# Patient Record
Sex: Female | Born: 1960 | Race: Black or African American | Hispanic: No | Marital: Married | Smoking: Former smoker
Health system: Southern US, Community
[De-identification: ages and names within clinical notes are randomized; demographics above are authoritative.]

## PROBLEM LIST (undated history)

## (undated) DIAGNOSIS — K219 Gastro-esophageal reflux disease without esophagitis: Secondary | ICD-10-CM

## (undated) DIAGNOSIS — Z970 Presence of artificial eye: Secondary | ICD-10-CM

## (undated) HISTORY — PX: EYE SURGERY: SHX253

---

## 1999-08-06 ENCOUNTER — Emergency Department (HOSPITAL_COMMUNITY): Admission: EM | Admit: 1999-08-06 | Discharge: 1999-08-07 | Payer: Self-pay | Admitting: Emergency Medicine

## 2004-12-19 ENCOUNTER — Emergency Department: Payer: Self-pay | Admitting: Emergency Medicine

## 2013-03-09 ENCOUNTER — Emergency Department: Payer: Self-pay | Admitting: Emergency Medicine

## 2013-03-09 LAB — BASIC METABOLIC PANEL
Anion Gap: 5 — ABNORMAL LOW (ref 7–16)
BUN: 10 mg/dL (ref 7–18)
Calcium, Total: 8.5 mg/dL (ref 8.5–10.1)
Chloride: 107 mmol/L (ref 98–107)
Co2: 26 mmol/L (ref 21–32)
Creatinine: 0.77 mg/dL (ref 0.60–1.30)
EGFR (Non-African Amer.): >60
Glucose: 95 mg/dL (ref 65–99)
Potassium: 3.9 mmol/L (ref 3.5–5.1)

## 2013-03-09 LAB — CBC
HCT: 34.6 % — ABNORMAL LOW (ref 35.0–47.0)
HGB: 11.8 g/dL — ABNORMAL LOW (ref 12.0–16.0)
MCHC: 34.1 g/dL (ref 32.0–36.0)
Platelet: 195 10*3/uL (ref 150–440)
RBC: 3.85 10*6/uL (ref 3.80–5.20)
RDW: 15 % — ABNORMAL HIGH (ref 11.5–14.5)

## 2017-08-12 NOTE — H&P (Signed)
Nancy Costa is a 57 y.o. female here for Fractional D+C and myosure resection of endometrial polyp . Pt with AUB  .    embx done :ENDOMETRIUM, BIOPSY:  BLOOD CLOTS AND SPARSE FRAGMENTS OF ENDOMETRIUM WITH BLEEDING/SHEDDING.  Here for SIS : + endometrial polyp  1.7x0.6 x0.5 cm   Fibroids x2 each 2 cm  Past Medical History:  has a past medical history of History of eye removal, Obesity, unspecified, and Tobacco use.  Past Surgical History:  has a past surgical history that includes SURGICAL REMOVAL RIGHT EYE; Cesarean section; and Tubal ligation. Family History: family history includes Breast cancer (age of onset: 74) in her sister; Heart disease in her father; High blood pressure (Hypertension) in her father and mother. Social History:  reports that she quit smoking about 2 years ago. Her smoking use included cigarettes. She has never used smokeless tobacco. She reports that she drinks alcohol. She reports that she does not use drugs. OB/GYN History:          OB History    Gravida  3   Para  2   Term      Preterm      AB  1   Living  2     SAB      TAB  1   Ectopic      Molar      Multiple      Live Births  2          Allergies: has No Known Allergies. Medications: No current outpatient medications on file.  Review of Systems: General:                      No fatigue or weight loss Eyes:                           No vision changes Ears:                            No hearing difficulty Respiratory:                No cough or shortness of breath Pulmonary:                  No asthma or shortness of breath Cardiovascular:           No chest pain, palpitations, dyspnea on exertion Gastrointestinal:          No abdominal bloating, chronic diarrhea, constipations, masses, pain or hematochezia Genitourinary:             No hematuria, dysuria, abnormal vaginal discharge, pelvic pain, Menometrorrhagia Lymphatic:                   No swollen lymph  nodes Musculoskeletal:         No muscle weakness Neurologic:                  No extremity weakness, syncope, seizure disorder Psychiatric:                  No history of depression, delusions or suicidal/homicidal ideation    Exam:      Vitals:   08/09/17 1043  BP: 143/85  Pulse: 69    Body mass index is 48.06 kg/m.  WDWN / black female in NAD   Lungs: CTA  CV : RRR without murmur  Neck:  no thyromegaly Abdomen: soft , no mass, normal active bowel sounds,  non-tender, no rebound tenderness Pelvic: tanner stage 5 ,  External genitalia: vulva /labia no lesions Urethra: no prolapse Vagina: normal physiologic d/c Cervix: no lesions, no cervical motion tenderness   Uterus: normal size shape and contour, non-tender Adnexa: no mass,  non-tender   Rectovaginal:  Saline infusion sonohysterography: betadine prep to the cervix followed by placement of the HSG catheter into the endometrial canal . Sterile H2O is injected while performing a transvaginal u/s . Findings: 1.7 x0.6 cm  Endometrial polyp   Impression:   The primary encounter diagnosis was Abnormal uterine bleeding (AUB), unspecified. A diagnosis of Endometrium, polyp was also pertinent to this visit.    Plan:    Spoke to her about Fractional D+C and myosure resection of endometrial polyp .She agrees to undergo the procedure  Benefits and risks to surgery: The proposed benefit of the surgery has been discussed with the patient. The possible risks include, but are not limited to: organ injury to the bowel , bladder, ureters, and major blood vessels and nerves. There is a possibility of additional surgeries resulting from these injuries. There is also the risk of blood transfusion and the need to receive blood products during or after the procedure which may rarely lead to HIV or Hepatitis C infection. There is a risk of developing a deep venous thrombosis or a pulmonary embolism . There is the possibility of  wound infection and also anesthetic complications, even the rare possibility of death. The patient understands these risks and wishes to proceed. All questions have been answered and the consent has been signed.        Orders Placed This Encounter  Procedures  . FSH, Serum - [facility]    Return if symptoms worsen or fail to improve, for preop.  Vilma PraderHOMAS JANSE SCHERMERHORN, MD

## 2017-08-18 ENCOUNTER — Encounter
Admission: RE | Admit: 2017-08-18 | Discharge: 2017-08-18 | Disposition: A | Discharge status: Home / Self Care | Payer: BC Managed Care – PPO | Source: Ambulatory Visit | Attending: Obstetrics and Gynecology | Admitting: Obstetrics and Gynecology

## 2017-08-18 ENCOUNTER — Other Ambulatory Visit: Payer: Self-pay

## 2017-08-18 HISTORY — DX: Gastro-esophageal reflux disease without esophagitis: K21.9

## 2017-08-18 HISTORY — DX: Presence of artificial eye: Z97.0

## 2017-08-18 NOTE — Patient Instructions (Signed)
Your procedure is scheduled on: 08-26-17 FRIDAY Report to Same Day Surgery 2nd floor medical mall [facility](Medical Mall Entrance-take elevator on left to 2nd floor.  Check in with surgery information desk.) To find out your arrival time please call [phone] between 1PM - 3PM on 08-25-17 THURSDAY  Remember: Instructions that are not followed completely may result in serious medical risk, up to and including death, or upon the discretion of your surgeon and anesthesiologist your surgery may need to be rescheduled.    _x___ 1. Do not eat food after midnight the night before your procedure. NO GUM OR CANDY AFTER MIDNIGHT.  You may drink clear liquids up to 2 hours before you are scheduled to arrive at the hospital for your procedure.  Do not drink clear liquids within 2 hours of your scheduled arrival to the hospital.  Clear liquids include  --Water or Apple juice without pulp  --Clear carbohydrate beverage such as ClearFast or Gatorade  --Black Coffee or Clear Tea (No milk, no creamers, do not add anything to the coffee or Tea     __x__ 2. No Alcohol for 24 hours before or after surgery.   __x__3. No Smoking or e-cigarettes for 24 prior to surgery.  Do not use any chewable tobacco products for at least 6 hour prior to surgery   ____  4. Bring all medications with you on the day of surgery if instructed.    __x__ 5. Notify your doctor if there is any change in your medical condition     (cold, fever, infections).    x___6. On the morning of surgery brush your teeth with toothpaste and water.  You may rinse your mouth with mouth wash if you wish.  Do not swallow any toothpaste or mouthwash.   Do not wear jewelry, make-up, hairpins, clips or nail polish.  Do not wear lotions, powders, or perfumes. You may wear deodorant.  Do not shave 48 hours prior to surgery. Men may shave face and neck.  Do not bring valuables to the hospital.    [facility] any belongings or  valuables.               Contacts, dentures or bridgework may not be worn into surgery.  Leave your suitcase in the car. After surgery it may be brought to your room.  For patients admitted to the hospital, discharge time is determined by your treatment team.  _  Patients discharged the day of surgery will not be allowed to drive home.  You will need someone to drive you home and stay with you the night of your procedure.    Please read over the following fact sheets that you were given:   [facility] Pre[facility] and or MRSA Information   _x___ TAKE THE FOLLOWING MEDICATION THE MORNING OF SURGERY WITH A SMALL SIP OF WATER. These include:  1. PRILOSEC  2. TAKE A PRILOSEC THE NIGHT BEFORE SURGERY  3.  4.  5.  6.  ____Fleets enema or Magnesium Citrate as directed.   ____ Use CHG Soap or sage wipes as directed on instruction sheet   ____ Use inhalers on the day of surgery and bring to hospital day of surgery  ____ Stop Metformin and Janumet 2 days prior to surgery.    ____ Take 1/2 of usual insulin dose the night before surgery and none on the morning surgery.   ____ Follow recommendations from Cardiologist, Pulmonologist or PCP regarding stopping Aspirin,  Coumadin, Plavix ,Eliquis, Effient, or Pradaxa, and Pletal.  X____Stop Anti-inflammatories such as Advil, Aleve, Ibuprofen, Motrin, Naproxen, Naprosyn, Goodies powders or aspirin products NOW-OK to take Tylenol    ____ Stop supplements until after surgery.     ____ Bring C-Pap to the hospital.

## 2017-08-22 ENCOUNTER — Encounter
Admission: RE | Admit: 2017-08-22 | Discharge: 2017-08-22 | Disposition: A | Discharge status: Home / Self Care | Payer: BC Managed Care – PPO | Source: Ambulatory Visit | Attending: Obstetrics and Gynecology | Admitting: Obstetrics and Gynecology

## 2017-08-22 DIAGNOSIS — Z01818 Encounter for other preprocedural examination: Secondary | ICD-10-CM | POA: Diagnosis not present

## 2017-08-22 DIAGNOSIS — Z01812 Encounter for preprocedural laboratory examination: Secondary | ICD-10-CM | POA: Insufficient documentation

## 2017-08-22 LAB — BASIC METABOLIC PANEL
ANION GAP: 9 (ref 5–15)
BUN: 10 mg/dL (ref 6–20)
CHLORIDE: 105 mmol/L (ref 101–111)
CO2: 27 mmol/L (ref 22–32)
Calcium: 9.3 mg/dL (ref 8.9–10.3)
Creatinine, Ser: 0.79 mg/dL (ref 0.44–1.00)
GFR calc Af Amer: >60 mL/min (ref >60)
GLUCOSE: 98 mg/dL (ref 65–99)
POTASSIUM: 3.8 mmol/L (ref 3.5–5.1)
SODIUM: 141 mmol/L (ref 135–145)

## 2017-08-22 LAB — CBC
HEMATOCRIT: 36.9 % (ref 35.0–47.0)
HEMOGLOBIN: 12.2 g/dL (ref 12.0–16.0)
MCH: 30.4 pg (ref 26.0–34.0)
MCHC: 32.9 g/dL (ref 32.0–36.0)
MCV: 92.4 fL (ref 80.0–100.0)
Platelets: 207 10*3/uL (ref 150–440)
RBC: 4 MIL/uL (ref 3.80–5.20)
RDW: 14.3 % (ref 11.5–14.5)
WBC: 5.1 10*3/uL (ref 3.6–11.0)

## 2017-08-22 LAB — TYPE AND SCREEN
ABO/RH(D): O POS
ANTIBODY SCREEN: NEGATIVE

## 2017-08-25 ENCOUNTER — Encounter: Payer: Self-pay | Admitting: *Deleted

## 2017-08-26 ENCOUNTER — Ambulatory Visit: Payer: BC Managed Care – PPO | Admitting: Anesthesiology

## 2017-08-26 ENCOUNTER — Ambulatory Visit
Admission: RE | Admit: 2017-08-26 | Discharge: 2017-08-26 | Disposition: A | Discharge status: Home / Self Care | Payer: BC Managed Care – PPO | Source: Ambulatory Visit | Attending: Obstetrics and Gynecology | Admitting: Obstetrics and Gynecology

## 2017-08-26 ENCOUNTER — Encounter
Admission: RE | Disposition: A | Discharge status: Home / Self Care | Payer: Self-pay | Source: Ambulatory Visit | Attending: Obstetrics and Gynecology

## 2017-08-26 ENCOUNTER — Encounter: Payer: Self-pay | Admitting: Anesthesiology

## 2017-08-26 DIAGNOSIS — Z6841 Body Mass Index (BMI) 40.0 and over, adult: Secondary | ICD-10-CM | POA: Diagnosis not present

## 2017-08-26 DIAGNOSIS — N950 Postmenopausal bleeding: Secondary | ICD-10-CM | POA: Diagnosis present

## 2017-08-26 DIAGNOSIS — N840 Polyp of corpus uteri: Secondary | ICD-10-CM | POA: Insufficient documentation

## 2017-08-26 DIAGNOSIS — Z8249 Family history of ischemic heart disease and other diseases of the circulatory system: Secondary | ICD-10-CM | POA: Diagnosis not present

## 2017-08-26 DIAGNOSIS — Z803 Family history of malignant neoplasm of breast: Secondary | ICD-10-CM | POA: Diagnosis not present

## 2017-08-26 DIAGNOSIS — K219 Gastro-esophageal reflux disease without esophagitis: Secondary | ICD-10-CM | POA: Diagnosis not present

## 2017-08-26 DIAGNOSIS — Z87891 Personal history of nicotine dependence: Secondary | ICD-10-CM | POA: Diagnosis not present

## 2017-08-26 HISTORY — PX: DILATATION & CURETTAGE/HYSTEROSCOPY WITH MYOSURE: SHX6511

## 2017-08-26 LAB — ABO/RH: ABO/RH(D): O POS

## 2017-08-26 LAB — POCT PREGNANCY, URINE: PREG TEST UR: NEGATIVE

## 2017-08-26 SURGERY — DILATATION & CURETTAGE/HYSTEROSCOPY WITH MYOSURE
Anesthesia: General | Wound class: Clean Contaminated

## 2017-08-26 MED ORDER — FENTANYL CITRATE (PF) 100 MCG/2ML IJ SOLN
INTRAMUSCULAR | Status: DC | PRN
  Administered 2017-08-26 (×2): 50 ug via INTRAVENOUS

## 2017-08-26 MED ORDER — SUCCINYLCHOLINE CHLORIDE 20 MG/ML IJ SOLN
INTRAMUSCULAR | Status: DC | PRN
  Administered 2017-08-26: 100 mg via INTRAVENOUS

## 2017-08-26 MED ORDER — SUGAMMADEX SODIUM 500 MG/5ML IV SOLN
INTRAVENOUS | Status: DC | PRN
  Administered 2017-08-26: 252.2 mg via INTRAVENOUS

## 2017-08-26 MED ORDER — CEFAZOLIN SODIUM-DEXTROSE 2-4 GM/100ML-% IV SOLN
2.0000 g | Freq: Once | INTRAVENOUS | Status: AC
  Administered 2017-08-26: 2 g via INTRAVENOUS

## 2017-08-26 MED ORDER — MIDAZOLAM HCL 2 MG/2ML IJ SOLN
INTRAMUSCULAR | Status: DC | PRN
  Administered 2017-08-26: 2 mg via INTRAVENOUS

## 2017-08-26 MED ORDER — PROPOFOL 10 MG/ML IV BOLUS
INTRAVENOUS | Status: DC | PRN
  Administered 2017-08-26: 200 mg via INTRAVENOUS

## 2017-08-26 MED ORDER — ONDANSETRON HCL 4 MG/2ML IJ SOLN: 4.0000 mg | Freq: Once | INTRAMUSCULAR | Status: DC | PRN

## 2017-08-26 MED ORDER — CEFAZOLIN SODIUM-DEXTROSE 2-4 GM/100ML-% IV SOLN
INTRAVENOUS | Status: AC
  Filled 2017-08-26: qty 100

## 2017-08-26 MED ORDER — ROCURONIUM BROMIDE 100 MG/10ML IV SOLN
INTRAVENOUS | Status: DC | PRN
  Administered 2017-08-26: 20 mg via INTRAVENOUS
  Administered 2017-08-26: 10 mg via INTRAVENOUS

## 2017-08-26 MED ORDER — LIDOCAINE HCL (CARDIAC) 20 MG/ML IV SOLN
INTRAVENOUS | Status: DC | PRN
  Administered 2017-08-26: 100 mg via INTRAVENOUS

## 2017-08-26 MED ORDER — MIDAZOLAM HCL 2 MG/2ML IJ SOLN
INTRAMUSCULAR | Status: AC
  Filled 2017-08-26: qty 2

## 2017-08-26 MED ORDER — ONDANSETRON HCL 4 MG/2ML IJ SOLN
INTRAMUSCULAR | Status: DC | PRN
  Administered 2017-08-26: 4 mg via INTRAVENOUS

## 2017-08-26 MED ORDER — FENTANYL CITRATE (PF) 100 MCG/2ML IJ SOLN
INTRAMUSCULAR | Status: AC
  Administered 2017-08-26: 25 ug via INTRAVENOUS
  Filled 2017-08-26: qty 2

## 2017-08-26 MED ORDER — LACTATED RINGERS IV SOLN
INTRAVENOUS | Status: DC
  Administered 2017-08-26: 10:00:00 via INTRAVENOUS

## 2017-08-26 MED ORDER — PROPOFOL 10 MG/ML IV BOLUS
INTRAVENOUS | Status: AC
  Filled 2017-08-26: qty 20

## 2017-08-26 MED ORDER — GLYCOPYRROLATE 0.2 MG/ML IJ SOLN
INTRAMUSCULAR | Status: DC | PRN
  Administered 2017-08-26: 0.2 mg via INTRAVENOUS

## 2017-08-26 MED ORDER — DEXAMETHASONE SODIUM PHOSPHATE 10 MG/ML IJ SOLN
INTRAMUSCULAR | Status: DC | PRN
  Administered 2017-08-26: 10 mg via INTRAVENOUS

## 2017-08-26 MED ORDER — SILVER NITRATE-POT NITRATE 75-25 % EX MISC
CUTANEOUS | Status: AC
  Filled 2017-08-26: qty 1

## 2017-08-26 MED ORDER — LACTATED RINGERS IV SOLN: INTRAVENOUS | Status: DC

## 2017-08-26 MED ORDER — FENTANYL CITRATE (PF) 100 MCG/2ML IJ SOLN
25.0000 ug | INTRAMUSCULAR | Status: DC | PRN
  Administered 2017-08-26 (×4): 25 ug via INTRAVENOUS

## 2017-08-26 MED ORDER — FENTANYL CITRATE (PF) 100 MCG/2ML IJ SOLN
INTRAMUSCULAR | Status: AC
  Filled 2017-08-26: qty 2

## 2017-08-26 SURGICAL SUPPLY — 21 items
ABLATOR ENDOMETRIAL MYOSURE (ABLATOR) IMPLANT
CANISTER SUC SOCK COL 7IN (MISCELLANEOUS) ×3 IMPLANT
CANISTER SUCT 3000ML PPV (MISCELLANEOUS) ×3 IMPLANT
CATH ROBINSON RED A/P 16FR (CATHETERS) ×3 IMPLANT
DEVICE MYOSURE LITE (MISCELLANEOUS) ×2 IMPLANT
GLOVE BIO SURGEON STRL SZ8 (GLOVE) ×3 IMPLANT
GOWN STRL REUS W/ TWL LRG LVL3 (GOWN DISPOSABLE) ×1 IMPLANT
GOWN STRL REUS W/ TWL XL LVL3 (GOWN DISPOSABLE) ×1 IMPLANT
GOWN STRL REUS W/TWL LRG LVL3 (GOWN DISPOSABLE) ×3
GOWN STRL REUS W/TWL XL LVL3 (GOWN DISPOSABLE) ×3
KIT TURNOVER CYSTO (KITS) ×3 IMPLANT
PACK DNC HYST (MISCELLANEOUS) ×3 IMPLANT
PAD OB MATERNITY 4.3X12.25 (PERSONAL CARE ITEMS) ×3 IMPLANT
PAD PREP 24X41 OB/GYN DISP (PERSONAL CARE ITEMS) ×3 IMPLANT
SOL .9 NS 3000ML IRR  AL (IV SOLUTION) ×2
SOL .9 NS 3000ML IRR AL (IV SOLUTION) ×1
SOL .9 NS 3000ML IRR UROMATIC (IV SOLUTION) ×1 IMPLANT
TOWEL OR 17X26 4PK STRL BLUE (TOWEL DISPOSABLE) ×3 IMPLANT
TUBING CONNECTING 10 (TUBING) ×2 IMPLANT
TUBING CONNECTING 10' (TUBING) ×1
TUBING HYSTEROSCOPY DOLPHIN (MISCELLANEOUS) ×3 IMPLANT

## 2017-08-26 NOTE — OR Nursing (Signed)
Discussed discharge instructions with pt and husband. Both voice understanding. 

## 2017-08-26 NOTE — Progress Notes (Signed)
Ready for surgery

## 2017-08-26 NOTE — Brief Op Note (Signed)
08/26/2017  10:56 AM  PATIENT:  Nancy Costa  57 y.o. female  PRE-OPERATIVE DIAGNOSIS:  AUB  Endometrial Polyp Postmenopausal bleeding  POST-OPERATIVE DIAGNOSIS:  Same  PROCEDURE:   Fractional dilation and curettage   Hysteroscopy with myosure resection of endometrial polyp  SURGEON:  Surgeon(s) and Role:    * Revere Maahs, Ihor Austinhomas J, MD - Primary  PHYSICIAN ASSISTANT: scrub tech   ASSISTANTS: none   ANESTHESIA:   general  EBL:  5 mL   BLOOD ADMINISTERED:none  DRAINS: none   LOCAL MEDICATIONS USED:  NONE  SPECIMEN:  Source of Specimen:  ecc, endometrial polyps with endometrial curettings   DISPOSITION OF SPECIMEN:  PATHOLOGY  COUNTS:  YES  TOURNIQUET:  * No tourniquets in log *  DICTATION: .Other Dictation: Dictation Number verbal   PLAN OF CARE: Discharge to home after PACU  PATIENT DISPOSITION:  PACU - hemodynamically stable.   Delay start of Pharmacological VTE agent (>24hrs) due to surgical blood loss or risk of bleeding: not applicable

## 2017-08-26 NOTE — Transfer of Care (Signed)
Immediate Anesthesia Transfer of Care Note  Patient: Nancy Costa  Procedure(s) Performed: DILATATION & CURETTAGE/HYSTEROSCOPY WITH MYOSURE RESECTION OF ENDOMETRIAL POLYPS (N/A )  Patient Location: PACU  Anesthesia Type:General  Level of Consciousness: awake, alert  and oriented  Airway & Oxygen Therapy: Patient Spontanous Breathing and Patient connected to face mask oxygen  Post-op Assessment: Report given to RN and Post -op Vital signs reviewed and stable  Post vital signs: Reviewed and stable  Last Vitals:  Vitals:   08/26/17 0925  BP: 135/80  Pulse: (!) 56  Resp: 17  Temp: (!) 36.1 C  SpO2: 100%    Last Pain:  Vitals:   08/26/17 0925  TempSrc: Temporal  PainSc: 0-No pain         Complications: No apparent anesthesia complications

## 2017-08-26 NOTE — Anesthesia Postprocedure Evaluation (Signed)
Anesthesia Post Note  Patient: Nancy Costa  Procedure(s) Performed: DILATATION & CURETTAGE/HYSTEROSCOPY WITH MYOSURE RESECTION OF ENDOMETRIAL POLYPS (N/A )  Patient location during evaluation: PACU Anesthesia Type: General Level of consciousness: awake and alert and oriented Pain management: pain level controlled Vital Signs Assessment: post-procedure vital signs reviewed and stable Respiratory status: spontaneous breathing Cardiovascular status: blood pressure returned to baseline Anesthetic complications: no     Last Vitals:  Vitals:   08/26/17 1206 08/26/17 1215  BP: 92/74 (!) 145/73  Pulse: (!) 55   Resp: 16 16  Temp: (!) 35.8 C   SpO2: 100% 100%    Last Pain:  Vitals:   08/26/17 1215  TempSrc:   PainSc: 2                  [name]

## 2017-08-26 NOTE — Anesthesia Preprocedure Evaluation (Signed)
Anesthesia Evaluation  Patient identified by MRN, date of birth, ID band Patient awake    Reviewed: Allergy & Precautions, NPO status , Patient's Chart, lab work & pertinent test results  Airway Mallampati: II  TM Distance: >3 FB     Dental   Pulmonary former smoker,    Pulmonary exam normal        Cardiovascular negative cardio ROS Normal cardiovascular exam     Neuro/Psych negative neurological ROS  negative psych ROS   GI/Hepatic Neg liver ROS, GERD  Medicated,  Endo/Other  Morbid obesity  Renal/GU negative Renal ROS  Female GU complaint     Musculoskeletal negative musculoskeletal ROS (+)   Abdominal Normal abdominal exam  (+)   Peds negative pediatric ROS (+)  Hematology negative hematology ROS (+)   Anesthesia Other Findings   Reproductive/Obstetrics                             Anesthesia Physical Anesthesia Plan  ASA: II  Anesthesia Plan: General   Post-op Pain Management:    Induction: Rapid sequence, Intravenous and Cricoid pressure planned  PONV Risk Score and Plan:   Airway Management Planned: Oral ETT  Additional Equipment:   Intra-op Plan:   Post-operative Plan: Extubation in OR  Informed Consent: I have reviewed the patients History and Physical, chart, labs and discussed the procedure including the risks, benefits and alternatives for the proposed anesthesia with the patient or authorized representative who has indicated his/her understanding and acceptance.   Dental advisory given  Plan Discussed with: CRNA and Surgeon  Anesthesia Plan Comments:         Anesthesia Quick Evaluation

## 2017-08-26 NOTE — Anesthesia Procedure Notes (Signed)
Procedure Name: Intubation Date/Time: 08/26/2017 10:37 AM Performed by: Nelda Marseille, CRNA Pre-anesthesia Checklist: Patient identified, Patient being monitored, Timeout performed, Emergency Drugs available and Suction available Patient Re-evaluated:Patient Re-evaluated prior to induction Oxygen Delivery Method: Circle system utilized Preoxygenation: Pre-oxygenation with 100% oxygen Induction Type: IV induction Ventilation: Mask ventilation without difficulty Laryngoscope Size: Mac and 3 Grade View: Grade III Tube type: Oral Tube size: 7.0 mm Number of attempts: 1 Airway Equipment and Method: Stylet Placement Confirmation: ETT inserted through vocal cords under direct vision,  positive ETCO2 and breath sounds checked- equal and bilateral Secured at: 21 cm Tube secured with: Tape Dental Injury: Teeth and Oropharynx as per pre-operative assessment

## 2017-08-26 NOTE — Anesthesia Post-op Follow-up Note (Signed)
Anesthesia QCDR form completed.        

## 2017-08-26 NOTE — Discharge Instructions (Signed)
Dilation and         Dilation and Curettage or Vacuum Curettage, Care After These instructions give you information about caring for yourself after your procedure. Your doctor may also give you more specific instructions. Call your doctor if you have any problems or questions after your procedure. Follow these instructions at home: Activity  Do not drive or use heavy machinery while taking prescription pain medicine.  For 24 hours after your procedure, avoid driving.  Take short walks often, followed by rest periods. Ask your doctor what activities are safe for you. After one or two days, you may be able to return to your normal activities.  Do not lift anything that is heavier than 10 lb (4.5 kg) until your doctor approves.  For at least 2 weeks, or as long as told by your doctor: ? Do not douche. ? Do not use tampons. ? Do not have sex. General instructions  Take over-the-counter and prescription medicines only as told by your doctor. This is very important if you take blood thinning medicine.  Do not take baths, swim, or use a hot tub until your doctor approves. Take showers instead of baths.  Wear compression stockings as told by your doctor.  It is up to you to get the results of your procedure. Ask your doctor when your results will be ready.  Keep all follow-up visits as told by your doctor. This is important. Contact a doctor if:  You have very bad cramps that get worse or do not get better with medicine.  You have very bad pain in your belly (abdomen).  You cannot drink fluids without throwing up (vomiting).  You get pain in a different part of the area between your belly and thighs (pelvis).  You have bad-smelling discharge from your vagina.  You have a rash. Get help right away if:  You are bleeding a lot from your vagina. A lot of bleeding means soaking more than one sanitary pad in an hour, for 2 hours in a row.  You have clumps of blood (blood clots)  coming from your vagina.  You have a fever or chills.  Your belly feels very tender or hard.  You have chest pain.  You have trouble breathing.  You cough up blood.  You feel dizzy.  You feel light-headed.  You pass out (faint).  You have pain in your neck or shoulder area. Summary  Take short walks often, followed by rest periods. Ask your doctor what activities are safe for you. After one or two days, you may be able to return to your normal activities.  Do not lift anything that is heavier than 10 lb (4.5 kg) until your doctor approves.  Do not take baths, swim, or use a hot tub until your doctor approves. Take showers instead of baths.  Contact your doctor if you have any symptoms of infection, like bad-smelling discharge from your vagina. This information is not intended to replace advice given to you by your health care provider. Make sure you discuss any questions you have with your health care provider. Document Released: 03/16/2008 Document Revised: 02/23/2016 Document Reviewed: 02/23/2016 Elsevier Interactive Patient Education  2017 [employer] AMBULATORY SURGERY  DISCHARGE INSTRUCTIONS   1) The drugs that you were given will stay in your system until tomorrow so for the next 24 hours you should not:  A) Drive an automobile B) Make any legal decisions C) Drink any alcoholic beverage   2) You may resume  regular meals tomorrow.  Today it is better to start with liquids and gradually work up to solid foods.  You may eat anything you prefer, but it is better to start with liquids, then soup and crackers, and gradually work up to solid foods.   3) Please notify your doctor immediately if you have any unusual bleeding, trouble breathing, redness and pain at the surgery site, drainage, fever, or pain not relieved by medication. 4)   5) Your post-operative visit with Dr.                                     is: Date:                        Time:    Please  call to schedule your post-operative visit.  6) Additional Instructions:

## 2017-08-29 LAB — SURGICAL PATHOLOGY

## 2017-08-29 NOTE — Op Note (Signed)
[place]:  Costa, Nancy             ACCOUNT NO.:  [id number]  MEDICAL RECORD NO.:  [id number]  LOCATION:                                 FACILITY:  PHYSICIAN:  Jennell Cornerhomas Schermerhorn, MDDATE OF BIRTH:  Jul 22, 1960  DATE OF PROCEDURE: DATE OF DISCHARGE:  08/26/2017                              OPERATIVE REPORT   PREOPERATIVE DIAGNOSIS: 1. Postmenopausal bleeding. 2. Endometrial polyps.  POSTOPERATIVE DIAGNOSIS: 1. Postmenopausal bleeding. 2. Endometrial polyps.  PROCEDURE PERFORMED: 1. Fractional dilation and curettage. 2. Hysteroscopy with MyoSure resection of endometrial polyps.  SURGEON:  Jennell Cornerhomas Schermerhorn, MD  ANESTHESIA:  General endotracheal anesthesia.  INDICATION:  A 57 year old gravida 3, para 2 patient with abnormal uterine bleeding seen in the clinic, and it was documented that the [facility]'s [facility] was elevated, therefore, this was consistent with postmenopausal bleeding.  The patient underwent endometrial biopsy that failed to show endometrial pathology and a saline infusion sonohysterography showed an endometrial polyp measuring 1.7 x 0.6 x 0.5 cm.  DESCRIPTION OF PROCEDURE:  After adequate general endotracheal anesthesia, the patient was placed in dorsal supine position with the legs in the candy-cane stirrups.  The patient's abdomen, perineum, and vagina were prepped and draped in normal sterile fashion.  Time-out was performed.  The patient did receive 2 g IV Ancef prior to commencement of the case.  The weighted speculum was placed in the posterior vaginal vault.  The bladder was drained with a Red Robinson catheter yielding 75 mL clear urine.  The anterior cervix was grasped with a single-tooth tenaculum and endocervical curettage was performed.  The cervix was then dilated to a #18 Hanks dilator without difficulty.  The hysteroscope was then advanced into the endometrial cavity without difficulty.  There was a large pedunculated polyp noted at the entrance  to the endometrial cavity and 2 smaller polyps noted at the posterior wall of the endometrial cavity.  MyoSure resector was brought up to the operative field and all 3 polyps were dissected to their base.  Good hemostasis was noted.  Pictures were taken.  The MyoSure was removed and endometrial curettage was performed with some bloody discharge noted. Good hemostasis was noted at the end of the case.  There were no complications.  ESTIMATED BLOOD LOSS:  5 mL.  INTRAOPERATIVE FLUIDS:  600 mL.  URINE OUTPUT:  75 mL and net deficit from the MyoSure procedure 110 mL normal saline.  The patient was taken to recovery room in good condition.          ______________________________ Jennell Cornerhomas Schermerhorn, MD     TS/MEDQ  D:  08/26/2017  T:  08/26/2017  Job:  [id number]

## 2018-02-05 ENCOUNTER — Emergency Department
Admission: EM | Admit: 2018-02-05 | Discharge: 2018-02-06 | Disposition: A | Discharge status: Home / Self Care | Payer: BC Managed Care – PPO | Attending: Emergency Medicine | Admitting: Emergency Medicine

## 2018-02-05 DIAGNOSIS — Z87891 Personal history of nicotine dependence: Secondary | ICD-10-CM | POA: Insufficient documentation

## 2018-02-05 DIAGNOSIS — F10929 Alcohol use, unspecified with intoxication, unspecified: Secondary | ICD-10-CM

## 2018-02-05 DIAGNOSIS — X30XXXA Exposure to excessive natural heat, initial encounter: Secondary | ICD-10-CM | POA: Insufficient documentation

## 2018-02-05 NOTE — ED Provider Notes (Signed)
[facility] Emergency Department Provider Note  ____________________________________________   First MD Initiated Contact with Patient 02/05/18 2356     (approximate)  I have reviewed the triage vital signs and the nursing notes.   HISTORY  Chief Complaint Heat Exposure and Alcohol Intoxication  Level 5 exemption history limited by the patient's intoxication  HPI Nancy Costa is a 57 y.o. female who comes to the emergency department via EMS with alcohol intoxication and "heat exposure".  Apparently the patient was outside drinking alcohol when she began to feel hot and sweaty and laid down on the ground.  The patient was initially nonverbal and minimally responsive with EMS and they gave her 0.5 mg of intravenous Narcan with minimal improvement.  The patient arrives heavily intoxicated and only says that she was drinking tonight and she is not really sure what happened.    Past Medical History:  Diagnosis Date  . GERD (gastroesophageal reflux disease)    OCC  . History of eye prosthesis    RIGHT EYE    There are no active problems to display for this patient.   Past Surgical History:  Procedure Laterality Date  . CESAREAN SECTION    . DILATATION & CURETTAGE/HYSTEROSCOPY WITH MYOSURE N/A 08/26/2017   Procedure: DILATATION & CURETTAGE/HYSTEROSCOPY WITH MYOSURE RESECTION OF ENDOMETRIAL POLYPS;  Surgeon: Suzy BouchardSchermerhorn, Thomas J, MD;  Location: [facility] ORS;  Service: Gynecology;  Laterality: N/A;  . EYE SURGERY Right    PROSTHESIS    Prior to Admission medications   Medication Sig Start Date End Date Taking? Authorizing Provider  omeprazole (PRILOSEC OTC) 20 MG tablet Take 20 mg by mouth as needed.    [provider]    Allergies Patient has no known allergies.  No family history on file.  Social History Social History   Tobacco Use  . Smoking status: Former Smoker    Packs/day: 0.25    Years: 30.00    Pack years: 7.50    Types:  Cigarettes    Last attempt to quit: 09/15/2016    Years since quitting: 1.3  . Smokeless tobacco: Never Used  Substance Use Topics  . Alcohol use: Yes    Frequency: Never    Comment: OCC  . Drug use: No    Review of Systems Level 5 exemption history limited by the patient's intoxication  ____________________________________________   PHYSICAL EXAM:  VITAL SIGNS: ED Triage Vitals  Enc Vitals Group     BP      Pulse      Resp      Temp      Temp src      SpO2      Weight      Height      Head Circumference      Peak Flow      Pain Score      Pain Loc      Pain Edu?      Excl. in GC?     Constitutional: Sleepy and somewhat difficult to arouse although arouses with verbal and painful stimulus.  Heavy alcohol on her breath and slurred speech Eyes: PERRL EOMI. midrange and brisk Head: Atraumatic. Nose: No congestion/rhinnorhea. Mouth/Throat: No trismus Neck: No stridor.   Cardiovascular: Normal rate, regular rhythm. Grossly normal heart sounds.  Good peripheral circulation. Respiratory: Slightly decreased respiratory effort.  No retractions. Lungs CTAB and moving good air Gastrointestinal: Morbidly obese soft nontender Musculoskeletal: No lower extremity edema   Neurologic:  . No  gross focal neurologic deficits are appreciated. Skin:  Skin is warm, dry and intact. No rash noted. Psychiatric: Heavily intoxicated  ____________________________________________   DIFFERENTIAL includes but not limited to  Alcohol intoxication, drug overdose, metabolic derangement, meningitis ____________________________________________   LABS (all labs ordered are listed, but only abnormal results are displayed)  Labs Reviewed  COMPREHENSIVE METABOLIC PANEL - Abnormal; Notable for the following components:      Result Value   Potassium 3.2 (*)    Calcium 8.2 (*)    All other components within normal limits  CBC WITH DIFFERENTIAL/PLATELET - Abnormal; Notable for the following  components:   RDW 14.9 (*)    All other components within normal limits  ETHANOL - Abnormal; Notable for the following components:   Alcohol, Ethyl (B) 218 (*)    All other components within normal limits  URINALYSIS, COMPLETE (UACMP) WITH MICROSCOPIC - Abnormal; Notable for the following components:   Color, Urine YELLOW (*)    APPearance HAZY (*)    Hgb urine dipstick SMALL (*)    All other components within normal limits  URINE DRUG SCREEN, QUALITATIVE ([facility] ONLY) - Abnormal; Notable for the following components:   Benzodiazepine, Ur Scrn TEST NOT PERFORMED, REAGENT NOT AVAILABLE (*)    All other components within normal limits  CK    Lab work reviewed by me consistent with significant ethanol intoxication __________________________________________  EKG   ____________________________________________  RADIOLOGY   ____________________________________________   PROCEDURES  Procedure(s) performed: no  Procedures  Critical Care performed: no  ____________________________________________   INITIAL IMPRESSION / ASSESSMENT AND PLAN / ED COURSE  Pertinent labs & imaging results that were available during my care of the patient were reviewed by me and considered in my medical decision making (see chart for details).   As part of my medical decision making, I reviewed the following data within the electronic MEDICAL RECORD NUMBER History obtained from family if available, nursing notes, old chart and ekg, as well as notes from prior ED visits.  The patient arrives heavily intoxicated.  Unclear what EMS meant by "heat exposure" however the patient's core temperature is normal.  Family was quickly at bedside who gives a story of alcohol use today and then a syncopal episode.  Labs are pending and will reevaluate.  After several hours of observation the patient feels improved and would like to go home with family.  Likely vasovagal syncope secondary to intoxication.       ____________________________________________   FINAL CLINICAL IMPRESSION(S) / ED DIAGNOSES  Final diagnoses:  Alcoholic intoxication with complication (HCC)      NEW MEDICATIONS STARTED DURING THIS VISIT:  Discharge Medication List as of 02/06/2018  1:10 AM       Note:  This document was prepared using Dragon voice recognition software and may include unintentional dictation errors.     Merrily Brittleifenbark, Christofer Shen, MD 02/07/18 [phone]

## 2018-02-05 NOTE — ED Triage Notes (Signed)
Patient coming [facility] for alcohol intoxication and possible heat exposure. Patient diaphoretic at EMS arrival, and nonverbal. pateint given 0.5 of narcan and became verbal. Patient's EMS vitals:  CBG 109, heart rate 80, BP 136/89

## 2018-02-06 LAB — URINALYSIS, COMPLETE (UACMP) WITH MICROSCOPIC
BACTERIA UA: NONE SEEN
BILIRUBIN URINE: NEGATIVE
Glucose, UA: NEGATIVE mg/dL
Ketones, ur: NEGATIVE mg/dL
LEUKOCYTES UA: NEGATIVE
NITRITE: NEGATIVE
PH: 5 (ref 5.0–8.0)
Protein, ur: NEGATIVE mg/dL
SPECIFIC GRAVITY, URINE: 1.009 (ref 1.005–1.030)

## 2018-02-06 LAB — URINE DRUG SCREEN, QUALITATIVE (ARMC ONLY)
Amphetamines, Ur Screen: NOT DETECTED
BARBITURATES, UR SCREEN: NOT DETECTED
COCAINE METABOLITE, UR ~~LOC~~: NOT DETECTED
Cannabinoid 50 Ng, Ur ~~LOC~~: NOT DETECTED
MDMA (ECSTASY) UR SCREEN: NOT DETECTED
METHADONE SCREEN, URINE: NOT DETECTED
Opiate, Ur Screen: NOT DETECTED
Phencyclidine (PCP) Ur S: NOT DETECTED
TRICYCLIC, UR SCREEN: NOT DETECTED

## 2018-02-06 LAB — COMPREHENSIVE METABOLIC PANEL
ALBUMIN: 3.6 g/dL (ref 3.5–5.0)
ALK PHOS: 91 U/L (ref 38–126)
ALT: 11 U/L (ref 0–44)
AST: 19 U/L (ref 15–41)
Anion gap: 11 (ref 5–15)
BILIRUBIN TOTAL: 0.5 mg/dL (ref 0.3–1.2)
BUN: 14 mg/dL (ref 6–20)
CALCIUM: 8.2 mg/dL — AB (ref 8.9–10.3)
CO2: 22 mmol/L (ref 22–32)
CREATININE: 0.85 mg/dL (ref 0.44–1.00)
Chloride: 109 mmol/L (ref 98–111)
GFR calc Af Amer: >60 mL/min (ref >60)
GLUCOSE: 97 mg/dL (ref 70–99)
POTASSIUM: 3.2 mmol/L — AB (ref 3.5–5.1)
Sodium: 142 mmol/L (ref 135–145)
TOTAL PROTEIN: 6.6 g/dL (ref 6.5–8.1)

## 2018-02-06 LAB — CBC WITH DIFFERENTIAL/PLATELET
BASOS ABS: 0.1 10*3/uL (ref 0–0.1)
BASOS PCT: 1 %
EOS ABS: 0.1 10*3/uL (ref 0–0.7)
EOS PCT: 2 %
HCT: 35.2 % (ref 35.0–47.0)
Hemoglobin: 12.1 g/dL (ref 12.0–16.0)
Lymphocytes Relative: 38 %
Lymphs Abs: 2.9 10*3/uL (ref 1.0–3.6)
MCH: 31.3 pg (ref 26.0–34.0)
MCHC: 34.4 g/dL (ref 32.0–36.0)
MCV: 91 fL (ref 80.0–100.0)
MONO ABS: 0.7 10*3/uL (ref 0.2–0.9)
Monocytes Relative: 9 %
Neutro Abs: 3.9 10*3/uL (ref 1.4–6.5)
Neutrophils Relative %: 50 %
PLATELETS: 174 10*3/uL (ref 150–440)
RBC: 3.87 MIL/uL (ref 3.80–5.20)
RDW: 14.9 % — AB (ref 11.5–14.5)
WBC: 7.8 10*3/uL (ref 3.6–11.0)

## 2018-02-06 LAB — CK: Total CK: 104 U/L (ref 38–234)

## 2018-02-06 LAB — ETHANOL: ALCOHOL ETHYL (B): 218 mg/dL — AB (ref <10)

## 2018-02-06 NOTE — Discharge Instructions (Signed)
It was a pleasure to take care of you today, and thank you for coming to our emergency department.  If you have any questions or concerns before leaving please ask the nurse to grab me and I'm more than happy to go through your aftercare instructions again.  If you were prescribed any opioid pain medication today such as Norco, Vicodin, Percocet, morphine, hydrocodone, or oxycodone please make sure you do not drive when you are taking this medication as it can alter your ability to drive safely.  If you have any concerns once you are home that you are not improving or are in fact getting worse before you can make it to your follow-up appointment, please do not hesitate to call 911 and come back for further evaluation.  Nancy BrittleNeil Lula Michaux, MD  Results for orders placed or performed during the hospital encounter of 02/05/18  Comprehensive metabolic panel  Result Value Ref Range   Sodium 142 135 - 145 mmol/L   Potassium 3.2 (L) 3.5 - 5.1 mmol/L   Chloride 109 98 - 111 mmol/L   CO2 22 22 - 32 mmol/L   Glucose, Bld 97 70 - 99 mg/dL   BUN 14 6 - 20 mg/dL   Creatinine, Ser 4.090.85 0.44 - 1.00 mg/dL   Calcium 8.2 (L) 8.9 - 10.3 mg/dL   Total Protein 6.6 6.5 - 8.1 g/dL   Albumin 3.6 3.5 - 5.0 g/dL   AST 19 15 - 41 U/L   ALT 11 0 - 44 U/L   Alkaline Phosphatase 91 38 - 126 U/L   Total Bilirubin 0.5 0.3 - 1.2 mg/dL   GFR calc non Af Amer >60 >60 mL/min   GFR calc Af Amer >60 >60 mL/min   Anion gap 11 5 - 15  CBC with Differential  Result Value Ref Range   WBC 7.8 3.6 - 11.0 K/uL   RBC 3.87 3.80 - 5.20 MIL/uL   Hemoglobin 12.1 12.0 - 16.0 g/dL   HCT 81.135.2 91.435.0 - 78.247.0 %   MCV 91.0 80.0 - 100.0 fL   MCH 31.3 26.0 - 34.0 pg   MCHC 34.4 32.0 - 36.0 g/dL   RDW 95.614.9 (H) 21.311.5 - 08.614.5 %   Platelets 174 150 - 440 K/uL   Neutrophils Relative % 50 %   Neutro Abs 3.9 1.4 - 6.5 K/uL   Lymphocytes Relative 38 %   Lymphs Abs 2.9 1.0 - 3.6 K/uL   Monocytes Relative 9 %   Monocytes Absolute 0.7 0.2 - 0.9 K/uL     Eosinophils Relative 2 %   Eosinophils Absolute 0.1 0 - 0.7 K/uL   Basophils Relative 1 %   Basophils Absolute 0.1 0 - 0.1 K/uL  Ethanol  Result Value Ref Range   Alcohol, Ethyl (B) 218 (H) <10 mg/dL  CK  Result Value Ref Range   Total CK 104 38 - 234 U/L

## 2018-02-06 NOTE — ED Notes (Signed)
ED Provider at bedside. 

## 2018-02-06 NOTE — ED Notes (Signed)
Reviewed discharge instructions, follow-up care with patient. Patient verbalized understanding of all information reviewed. Patient stable, with no distress noted at this time.    

## 2020-05-26 ENCOUNTER — Other Ambulatory Visit: Payer: Self-pay

## 2020-05-26 ENCOUNTER — Emergency Department: Payer: BC Managed Care – PPO

## 2020-05-26 DIAGNOSIS — Z87891 Personal history of nicotine dependence: Secondary | ICD-10-CM | POA: Diagnosis not present

## 2020-05-26 DIAGNOSIS — Y92410 Unspecified street and highway as the place of occurrence of the external cause: Secondary | ICD-10-CM | POA: Diagnosis not present

## 2020-05-26 DIAGNOSIS — S4992XA Unspecified injury of left shoulder and upper arm, initial encounter: Secondary | ICD-10-CM | POA: Diagnosis present

## 2020-05-26 DIAGNOSIS — S40212A Abrasion of left shoulder, initial encounter: Secondary | ICD-10-CM | POA: Diagnosis not present

## 2020-05-26 DIAGNOSIS — R519 Headache, unspecified: Secondary | ICD-10-CM | POA: Insufficient documentation
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
## 2020-05-26 NOTE — ED Notes (Signed)
[employer] here to speak with pt.  °

## 2020-05-27 ENCOUNTER — Emergency Department: Payer: BC Managed Care – PPO

## 2020-05-27 ENCOUNTER — Emergency Department
Admission: EM | Admit: 2020-05-27 | Discharge: 2020-05-27 | Disposition: A | Discharge status: Home / Self Care | Payer: BC Managed Care – PPO | Attending: Emergency Medicine | Admitting: Emergency Medicine

## 2020-05-27 DIAGNOSIS — S40212A Abrasion of left shoulder, initial encounter: Secondary | ICD-10-CM

## 2020-05-27 MED ORDER — IBUPROFEN 400 MG PO TABS
400.0000 mg | ORAL_TABLET | Freq: Once | ORAL | Status: AC
  Administered 2020-05-27: 400 mg via ORAL
  Filled 2020-05-27: qty 1

## 2020-05-27 MED ORDER — ACETAMINOPHEN 500 MG PO TABS
1000.0000 mg | ORAL_TABLET | Freq: Once | ORAL | Status: AC
  Administered 2020-05-27: 1000 mg via ORAL
  Filled 2020-05-27: qty 2
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
## 2020-05-27 NOTE — Discharge Instructions (Signed)
We did CT scans of your head and neck, x-ray of your chest and an EKG that all looked quite normal.  Please take Tylenol and ibuprofen/Advil for your pain.  It is safe to take them together, or to alternate them every few hours.  Take up to 1000mg  of Tylenol at a time, up to 4 times per day.  Do not take more than 4000 mg of Tylenol in 24 hours.  For ibuprofen, take 400-600 mg, 4-5 times per day.  If you develop any fevers, recurrently passing out or difficulty walking, please return to the ED.

## 2020-07-15 ENCOUNTER — Emergency Department
Admission: EM | Admit: 2020-07-15 | Discharge: 2020-07-16 | Discharge status: Against Medical Advice | Payer: BC Managed Care – PPO

## 2021-01-20 ENCOUNTER — Emergency Department
Admission: EM | Admit: 2021-01-20 | Discharge: 2021-01-20 | Disposition: A | Discharge status: Home / Self Care | Payer: BC Managed Care – PPO | Attending: Emergency Medicine | Admitting: Emergency Medicine

## 2021-01-20 ENCOUNTER — Other Ambulatory Visit: Payer: Self-pay

## 2021-01-20 DIAGNOSIS — Z87891 Personal history of nicotine dependence: Secondary | ICD-10-CM | POA: Diagnosis not present

## 2021-01-20 DIAGNOSIS — Y9389 Activity, other specified: Secondary | ICD-10-CM | POA: Insufficient documentation

## 2021-01-20 DIAGNOSIS — W260XXA Contact with knife, initial encounter: Secondary | ICD-10-CM | POA: Diagnosis not present

## 2021-01-20 DIAGNOSIS — Z23 Encounter for immunization: Secondary | ICD-10-CM | POA: Diagnosis not present

## 2021-01-20 DIAGNOSIS — S61012A Laceration without foreign body of left thumb without damage to nail, initial encounter: Secondary | ICD-10-CM | POA: Diagnosis not present

## 2021-01-20 DIAGNOSIS — S6992XA Unspecified injury of left wrist, hand and finger(s), initial encounter: Secondary | ICD-10-CM | POA: Diagnosis present

## 2021-01-20 MED ORDER — TETANUS-DIPHTH-ACELL PERTUSSIS 5-2.5-18.5 LF-MCG/0.5 IM SUSY
0.5000 mL | PREFILLED_SYRINGE | Freq: Once | INTRAMUSCULAR | Status: AC
  Administered 2021-01-20: 0.5 mL via INTRAMUSCULAR
  Filled 2021-01-20: qty 0.5

## 2021-01-20 NOTE — ED Triage Notes (Signed)
Pt states she was cutting watermelon tonight with large knife and accidentally cut her left thumb, bleeding controlled, pt has thumb bandaged.

## 2021-01-20 NOTE — ED Notes (Signed)
Wound glued, dressed, and finger splinted by Leonides Schanz, PA

## 2021-01-20 NOTE — ED Provider Notes (Signed)
[facility]-EMERGENCY DEPARTMENT  ____________________________________________  Time seen: Approximately 9:38 PM  I have reviewed the triage vital signs and the nursing notes.   HISTORY  Chief Complaint Laceration   Historian Patient     HPI Nancy Costa is a 60 y.o. female presents to the emergency department with a 2 cm, well approximated laceration along the radial aspect of the left thumb sustained accidentally while patient was cutting fruit earlier tonight.  Last tetanus shot was more than 5 years ago.  She denies numbness or tingling in the upper extremities.  No other alleviating measures have been attempted.   Past Medical History:  Diagnosis Date   GERD (gastroesophageal reflux disease)    OCC   History of eye prosthesis    RIGHT EYE     Immunizations up to date:  Yes.     Past Medical History:  Diagnosis Date   GERD (gastroesophageal reflux disease)    OCC   History of eye prosthesis    RIGHT EYE    There are no problems to display for this patient.   Past Surgical History:  Procedure Laterality Date   CESAREAN SECTION     DILATATION & CURETTAGE/HYSTEROSCOPY WITH MYOSURE N/A 08/26/2017   Procedure: DILATATION & CURETTAGE/HYSTEROSCOPY WITH MYOSURE RESECTION OF ENDOMETRIAL POLYPS;  Surgeon: Suzy Bouchard, MD;  Location: [facility] ORS;  Service: Gynecology;  Laterality: N/A;   EYE SURGERY Right    PROSTHESIS    Prior to Admission medications   Medication Sig Start Date End Date Taking? Authorizing Provider  omeprazole (PRILOSEC OTC) 20 MG tablet Take 20 mg by mouth as needed.    [provider]    Allergies Patient has no known allergies.  No family history on file.  Social History Social History   Tobacco Use   Smoking status: Former    Packs/day: 0.25    Years: 30.00    Pack years: 7.50    Types: Cigarettes    Quit date: 09/15/2016    Years since quitting: 4.3   Smokeless tobacco: Never  Vaping Use   Vaping Use: Never  used  Substance Use Topics   Alcohol use: Yes    Comment: OCC   Drug use: No     Review of Systems  Constitutional: No fever/chills Eyes:  No discharge ENT: No upper respiratory complaints. Respiratory: no cough. No SOB/ use of accessory muscles to breath Gastrointestinal:   No nausea, no vomiting.  No diarrhea.  No constipation. Musculoskeletal: Negative for musculoskeletal pain. Skin: Patient has left thumb laceration.     ____________________________________________   PHYSICAL EXAM:  VITAL SIGNS: ED Triage Vitals  Enc Vitals Group     BP 01/20/21 2034 (!) 163/120     Pulse Rate 01/20/21 2034 79     Resp 01/20/21 2034 18     Temp 01/20/21 2034 97.7 F (36.5 C)     Temp Source 01/20/21 2034 Oral     SpO2 01/20/21 2034 100 %     Weight 01/20/21 2033 240 lb (108.9 kg)     Height 01/20/21 2033 5\' 4"  (1.626 m)     Head Circumference --      Peak Flow --      Pain Score 01/20/21 2033 0     Pain Loc --      Pain Edu? --      Excl. in GC? --      Constitutional: Alert and oriented. Well appearing and in no acute distress. Eyes: Conjunctivae are  normal. PERRL. EOMI. Head: Atraumatic. ENT: Cardiovascular: Normal rate, regular rhythm. Normal S1 and S2.  Good peripheral circulation. Respiratory: Normal respiratory effort without tachypnea or retractions. Lungs CTAB. Good air entry to the bases with no decreased or absent breath sounds Gastrointestinal: Bowel sounds x 4 quadrants. Soft and nontender to palpation. No guarding or rigidity. No distention. Musculoskeletal: Full range of motion to all extremities. No obvious deformities noted.  No flexor or extensor tendon deficits appreciated with testing of the left hand. Neurologic:  Normal for age. No gross focal neurologic deficits are appreciated.  Skin: Patient has a 2 cm, well approximated, linear laceration along the radial aspect of the left thumb. Psychiatric: Mood and affect are normal for age. Speech and behavior  are normal.   ____________________________________________   LABS (all labs ordered are listed, but only abnormal results are displayed)  Labs Reviewed - No data to display ____________________________________________  EKG   ____________________________________________  RADIOLOGY  No results found.  ____________________________________________    PROCEDURES  Procedure(s) performed:     Marland KitchenMarland KitchenLaceration Repair  Date/Time: 01/20/2021 9:40 PM Performed by: Orvil Feil, PA-C Authorized by: Orvil Feil, PA-C   Consent:    Consent obtained:  Verbal   Risks discussed:  Infection and pain Universal protocol:    Procedure explained and questions answered to patient or proxy's satisfaction: yes     Patient identity confirmed:  Verbally with patient Laceration details:    Location:  Finger   Finger location:  L thumb   Length (cm):  2 Pre-procedure details:    Preparation:  Patient was prepped and draped in usual sterile fashion Exploration:    Limited defect created (wound extended): no   Treatment:    Amount of cleaning:  Standard   Irrigation solution:  Sterile saline   Debridement:  None Skin repair:    Repair method:  Tissue adhesive Approximation:    Approximation:  Close Repair type:    Repair type:  Simple Post-procedure details:    Dressing:  Splint for protection   Procedure completion:  Tolerated well, no immediate complications     Medications  Tdap (BOOSTRIX) injection 0.5 mL (0.5 mLs Intramuscular Given 01/20/21 2108)     ____________________________________________   INITIAL IMPRESSION / ASSESSMENT AND PLAN / ED COURSE  Pertinent labs & imaging results that were available during my care of the patient were reviewed by me and considered in my medical decision making (see chart for details).      Assessment and Plan:  60 year old female presents to the emergency department with a superficial laceration along the radial aspect of the  left thumb repaired in the emergency department without complication.  Patient's left thumb was splinted into extension.  Patient's tetanus status was updated prior to discharge.  All patient questions were answered.     ____________________________________________  FINAL CLINICAL IMPRESSION(S) / ED DIAGNOSES  Final diagnoses:  Laceration of left thumb without foreign body without damage to nail, initial encounter      NEW MEDICATIONS STARTED DURING THIS VISIT:  ED Discharge Orders     None           This chart was dictated using voice recognition software/Dragon. Despite best efforts to proofread, errors can occur which can change the meaning. Any change was purely unintentional.     Orvil Feil, PA-C 01/20/21 2142    Concha Se, MD 01/21/21 [phone]

## 2021-01-20 NOTE — ED Notes (Signed)
See triage note - pt presents with small laceration to left thumb, bleeding controlled upon arrival to ED. Attending PA at bedside.
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
